# Patient Record
Sex: Female | Born: 1935 | Race: Black or African American | Hispanic: No
Health system: Southern US, Community
[De-identification: ages and names within clinical notes are randomized; demographics above are authoritative.]

---

## 2019-03-11 DIAGNOSIS — E039 Hypothyroidism, unspecified: Secondary | ICD-10-CM | POA: Diagnosis not present

## 2019-03-11 DIAGNOSIS — N183 Chronic kidney disease, stage 3 (moderate): Secondary | ICD-10-CM | POA: Diagnosis not present

## 2019-03-11 DIAGNOSIS — I129 Hypertensive chronic kidney disease with stage 1 through stage 4 chronic kidney disease, or unspecified chronic kidney disease: Secondary | ICD-10-CM | POA: Diagnosis not present

## 2019-03-11 DIAGNOSIS — E559 Vitamin D deficiency, unspecified: Secondary | ICD-10-CM | POA: Diagnosis not present

## 2019-03-14 DIAGNOSIS — N183 Chronic kidney disease, stage 3 (moderate): Secondary | ICD-10-CM | POA: Diagnosis not present

## 2019-03-14 DIAGNOSIS — I129 Hypertensive chronic kidney disease with stage 1 through stage 4 chronic kidney disease, or unspecified chronic kidney disease: Secondary | ICD-10-CM | POA: Diagnosis not present

## 2019-03-14 DIAGNOSIS — E039 Hypothyroidism, unspecified: Secondary | ICD-10-CM | POA: Diagnosis not present

## 2019-03-14 DIAGNOSIS — E559 Vitamin D deficiency, unspecified: Secondary | ICD-10-CM | POA: Diagnosis not present

## 2019-03-17 DIAGNOSIS — Z79899 Other long term (current) drug therapy: Secondary | ICD-10-CM | POA: Diagnosis not present

## 2019-03-18 DIAGNOSIS — E7849 Other hyperlipidemia: Secondary | ICD-10-CM | POA: Diagnosis not present

## 2019-03-18 DIAGNOSIS — E8809 Other disorders of plasma-protein metabolism, not elsewhere classified: Secondary | ICD-10-CM | POA: Diagnosis not present

## 2019-03-18 DIAGNOSIS — I129 Hypertensive chronic kidney disease with stage 1 through stage 4 chronic kidney disease, or unspecified chronic kidney disease: Secondary | ICD-10-CM | POA: Diagnosis not present

## 2019-03-18 DIAGNOSIS — R748 Abnormal levels of other serum enzymes: Secondary | ICD-10-CM | POA: Diagnosis not present

## 2019-03-25 DIAGNOSIS — R2681 Unsteadiness on feet: Secondary | ICD-10-CM | POA: Diagnosis not present

## 2019-03-25 DIAGNOSIS — F4489 Other dissociative and conversion disorders: Secondary | ICD-10-CM | POA: Diagnosis not present

## 2019-03-25 DIAGNOSIS — I129 Hypertensive chronic kidney disease with stage 1 through stage 4 chronic kidney disease, or unspecified chronic kidney disease: Secondary | ICD-10-CM | POA: Diagnosis not present

## 2019-03-26 DIAGNOSIS — Z79899 Other long term (current) drug therapy: Secondary | ICD-10-CM | POA: Diagnosis not present

## 2019-04-01 DIAGNOSIS — N183 Chronic kidney disease, stage 3 (moderate): Secondary | ICD-10-CM | POA: Diagnosis not present

## 2019-04-01 DIAGNOSIS — I129 Hypertensive chronic kidney disease with stage 1 through stage 4 chronic kidney disease, or unspecified chronic kidney disease: Secondary | ICD-10-CM | POA: Diagnosis not present

## 2019-04-01 DIAGNOSIS — E039 Hypothyroidism, unspecified: Secondary | ICD-10-CM | POA: Diagnosis not present

## 2019-04-01 DIAGNOSIS — F5109 Other insomnia not due to a substance or known physiological condition: Secondary | ICD-10-CM | POA: Diagnosis not present

## 2019-04-15 DIAGNOSIS — F5109 Other insomnia not due to a substance or known physiological condition: Secondary | ICD-10-CM | POA: Diagnosis not present

## 2019-04-15 DIAGNOSIS — I129 Hypertensive chronic kidney disease with stage 1 through stage 4 chronic kidney disease, or unspecified chronic kidney disease: Secondary | ICD-10-CM | POA: Diagnosis not present

## 2019-04-15 DIAGNOSIS — E785 Hyperlipidemia, unspecified: Secondary | ICD-10-CM | POA: Diagnosis not present

## 2019-04-15 DIAGNOSIS — E559 Vitamin D deficiency, unspecified: Secondary | ICD-10-CM | POA: Diagnosis not present

## 2019-04-30 DIAGNOSIS — I129 Hypertensive chronic kidney disease with stage 1 through stage 4 chronic kidney disease, or unspecified chronic kidney disease: Secondary | ICD-10-CM | POA: Diagnosis not present

## 2019-04-30 DIAGNOSIS — E559 Vitamin D deficiency, unspecified: Secondary | ICD-10-CM | POA: Diagnosis not present

## 2019-04-30 DIAGNOSIS — E7849 Other hyperlipidemia: Secondary | ICD-10-CM | POA: Diagnosis not present

## 2019-04-30 DIAGNOSIS — F5109 Other insomnia not due to a substance or known physiological condition: Secondary | ICD-10-CM | POA: Diagnosis not present

## 2019-05-13 DIAGNOSIS — R748 Abnormal levels of other serum enzymes: Secondary | ICD-10-CM | POA: Diagnosis not present

## 2019-05-13 DIAGNOSIS — I129 Hypertensive chronic kidney disease with stage 1 through stage 4 chronic kidney disease, or unspecified chronic kidney disease: Secondary | ICD-10-CM | POA: Diagnosis not present

## 2019-05-13 DIAGNOSIS — F5109 Other insomnia not due to a substance or known physiological condition: Secondary | ICD-10-CM | POA: Diagnosis not present

## 2019-05-13 DIAGNOSIS — R634 Abnormal weight loss: Secondary | ICD-10-CM | POA: Diagnosis not present

## 2019-05-16 DIAGNOSIS — R634 Abnormal weight loss: Secondary | ICD-10-CM | POA: Diagnosis not present

## 2019-05-16 DIAGNOSIS — I129 Hypertensive chronic kidney disease with stage 1 through stage 4 chronic kidney disease, or unspecified chronic kidney disease: Secondary | ICD-10-CM | POA: Diagnosis not present

## 2019-05-16 DIAGNOSIS — F5109 Other insomnia not due to a substance or known physiological condition: Secondary | ICD-10-CM | POA: Diagnosis not present

## 2019-05-16 DIAGNOSIS — R945 Abnormal results of liver function studies: Secondary | ICD-10-CM | POA: Diagnosis not present

## 2019-06-03 DIAGNOSIS — N183 Chronic kidney disease, stage 3 (moderate): Secondary | ICD-10-CM | POA: Diagnosis not present

## 2019-06-03 DIAGNOSIS — E039 Hypothyroidism, unspecified: Secondary | ICD-10-CM | POA: Diagnosis not present

## 2019-06-03 DIAGNOSIS — F5109 Other insomnia not due to a substance or known physiological condition: Secondary | ICD-10-CM | POA: Diagnosis not present

## 2019-06-03 DIAGNOSIS — I129 Hypertensive chronic kidney disease with stage 1 through stage 4 chronic kidney disease, or unspecified chronic kidney disease: Secondary | ICD-10-CM | POA: Diagnosis not present

## 2019-06-06 DIAGNOSIS — N183 Chronic kidney disease, stage 3 (moderate): Secondary | ICD-10-CM | POA: Diagnosis not present

## 2019-06-06 DIAGNOSIS — I129 Hypertensive chronic kidney disease with stage 1 through stage 4 chronic kidney disease, or unspecified chronic kidney disease: Secondary | ICD-10-CM | POA: Diagnosis not present

## 2019-06-06 DIAGNOSIS — E039 Hypothyroidism, unspecified: Secondary | ICD-10-CM | POA: Diagnosis not present

## 2019-06-06 DIAGNOSIS — F5109 Other insomnia not due to a substance or known physiological condition: Secondary | ICD-10-CM | POA: Diagnosis not present

## 2019-06-09 DIAGNOSIS — Z20828 Contact with and (suspected) exposure to other viral communicable diseases: Secondary | ICD-10-CM | POA: Diagnosis not present

## 2019-06-12 DIAGNOSIS — I129 Hypertensive chronic kidney disease with stage 1 through stage 4 chronic kidney disease, or unspecified chronic kidney disease: Secondary | ICD-10-CM | POA: Diagnosis not present

## 2019-06-12 DIAGNOSIS — G301 Alzheimer's disease with late onset: Secondary | ICD-10-CM | POA: Diagnosis not present

## 2019-06-12 DIAGNOSIS — E039 Hypothyroidism, unspecified: Secondary | ICD-10-CM | POA: Diagnosis not present

## 2019-06-12 DIAGNOSIS — E782 Mixed hyperlipidemia: Secondary | ICD-10-CM | POA: Diagnosis not present

## 2019-08-04 DIAGNOSIS — Z79899 Other long term (current) drug therapy: Secondary | ICD-10-CM | POA: Diagnosis not present

## 2019-09-30 DIAGNOSIS — Z23 Encounter for immunization: Secondary | ICD-10-CM | POA: Diagnosis not present

## 2020-02-02 DIAGNOSIS — Z0000 Encounter for general adult medical examination without abnormal findings: Secondary | ICD-10-CM | POA: Diagnosis not present

## 2020-02-02 DIAGNOSIS — I129 Hypertensive chronic kidney disease with stage 1 through stage 4 chronic kidney disease, or unspecified chronic kidney disease: Secondary | ICD-10-CM | POA: Diagnosis not present

## 2020-02-02 DIAGNOSIS — E782 Mixed hyperlipidemia: Secondary | ICD-10-CM | POA: Diagnosis not present

## 2020-02-02 DIAGNOSIS — H6123 Impacted cerumen, bilateral: Secondary | ICD-10-CM | POA: Diagnosis not present

## 2020-02-02 DIAGNOSIS — E039 Hypothyroidism, unspecified: Secondary | ICD-10-CM | POA: Diagnosis not present

## 2020-02-15 DIAGNOSIS — N390 Urinary tract infection, site not specified: Secondary | ICD-10-CM | POA: Diagnosis not present

## 2020-02-15 DIAGNOSIS — H6120 Impacted cerumen, unspecified ear: Secondary | ICD-10-CM | POA: Diagnosis not present

## 2020-02-24 DIAGNOSIS — N390 Urinary tract infection, site not specified: Secondary | ICD-10-CM | POA: Diagnosis not present

## 2020-05-04 DIAGNOSIS — H5213 Myopia, bilateral: Secondary | ICD-10-CM | POA: Diagnosis not present

## 2020-05-18 DIAGNOSIS — H2513 Age-related nuclear cataract, bilateral: Secondary | ICD-10-CM | POA: Diagnosis not present

## 2020-05-18 DIAGNOSIS — H401131 Primary open-angle glaucoma, bilateral, mild stage: Secondary | ICD-10-CM | POA: Diagnosis not present

## 2020-05-23 DIAGNOSIS — H2513 Age-related nuclear cataract, bilateral: Secondary | ICD-10-CM | POA: Diagnosis not present

## 2020-05-23 DIAGNOSIS — H401131 Primary open-angle glaucoma, bilateral, mild stage: Secondary | ICD-10-CM | POA: Diagnosis not present

## 2020-05-23 DIAGNOSIS — H25013 Cortical age-related cataract, bilateral: Secondary | ICD-10-CM | POA: Diagnosis not present

## 2020-06-02 DIAGNOSIS — L851 Acquired keratosis [keratoderma] palmaris et plantaris: Secondary | ICD-10-CM | POA: Diagnosis not present

## 2020-06-02 DIAGNOSIS — M79673 Pain in unspecified foot: Secondary | ICD-10-CM | POA: Diagnosis not present

## 2020-06-02 DIAGNOSIS — B351 Tinea unguium: Secondary | ICD-10-CM | POA: Diagnosis not present

## 2020-06-02 DIAGNOSIS — M19079 Primary osteoarthritis, unspecified ankle and foot: Secondary | ICD-10-CM | POA: Diagnosis not present

## 2020-06-28 DIAGNOSIS — H2511 Age-related nuclear cataract, right eye: Secondary | ICD-10-CM | POA: Diagnosis not present

## 2020-06-28 DIAGNOSIS — H25011 Cortical age-related cataract, right eye: Secondary | ICD-10-CM | POA: Diagnosis not present

## 2020-06-28 DIAGNOSIS — H21561 Pupillary abnormality, right eye: Secondary | ICD-10-CM | POA: Diagnosis not present

## 2020-06-28 DIAGNOSIS — H25811 Combined forms of age-related cataract, right eye: Secondary | ICD-10-CM | POA: Diagnosis not present

## 2020-08-04 DIAGNOSIS — B351 Tinea unguium: Secondary | ICD-10-CM | POA: Diagnosis not present

## 2020-08-04 DIAGNOSIS — M79673 Pain in unspecified foot: Secondary | ICD-10-CM | POA: Diagnosis not present

## 2020-08-04 DIAGNOSIS — L605 Yellow nail syndrome: Secondary | ICD-10-CM | POA: Diagnosis not present

## 2020-08-04 DIAGNOSIS — M19079 Primary osteoarthritis, unspecified ankle and foot: Secondary | ICD-10-CM | POA: Diagnosis not present

## 2020-08-09 DIAGNOSIS — I10 Essential (primary) hypertension: Secondary | ICD-10-CM | POA: Diagnosis not present

## 2020-08-09 DIAGNOSIS — G301 Alzheimer's disease with late onset: Secondary | ICD-10-CM | POA: Diagnosis not present

## 2020-09-06 DIAGNOSIS — H25012 Cortical age-related cataract, left eye: Secondary | ICD-10-CM | POA: Diagnosis not present

## 2020-09-06 DIAGNOSIS — H25812 Combined forms of age-related cataract, left eye: Secondary | ICD-10-CM | POA: Diagnosis not present

## 2020-09-06 DIAGNOSIS — H2512 Age-related nuclear cataract, left eye: Secondary | ICD-10-CM | POA: Diagnosis not present

## 2020-09-06 DIAGNOSIS — H5709 Other anomalies of pupillary function: Secondary | ICD-10-CM | POA: Diagnosis not present

## 2020-09-16 DIAGNOSIS — I10 Essential (primary) hypertension: Secondary | ICD-10-CM | POA: Diagnosis not present

## 2020-09-16 DIAGNOSIS — G301 Alzheimer's disease with late onset: Secondary | ICD-10-CM | POA: Diagnosis not present

## 2020-09-16 DIAGNOSIS — E782 Mixed hyperlipidemia: Secondary | ICD-10-CM | POA: Diagnosis not present

## 2020-09-16 DIAGNOSIS — E039 Hypothyroidism, unspecified: Secondary | ICD-10-CM | POA: Diagnosis not present

## 2020-10-26 DIAGNOSIS — L851 Acquired keratosis [keratoderma] palmaris et plantaris: Secondary | ICD-10-CM | POA: Diagnosis not present

## 2020-10-26 DIAGNOSIS — M19079 Primary osteoarthritis, unspecified ankle and foot: Secondary | ICD-10-CM | POA: Diagnosis not present

## 2020-10-26 DIAGNOSIS — B351 Tinea unguium: Secondary | ICD-10-CM | POA: Diagnosis not present

## 2020-10-26 DIAGNOSIS — M79673 Pain in unspecified foot: Secondary | ICD-10-CM | POA: Diagnosis not present

## 2020-11-29 DIAGNOSIS — R4189 Other symptoms and signs involving cognitive functions and awareness: Secondary | ICD-10-CM | POA: Diagnosis not present

## 2020-12-21 DIAGNOSIS — G301 Alzheimer's disease with late onset: Secondary | ICD-10-CM | POA: Diagnosis not present

## 2020-12-21 DIAGNOSIS — I10 Essential (primary) hypertension: Secondary | ICD-10-CM | POA: Diagnosis not present

## 2020-12-21 DIAGNOSIS — E782 Mixed hyperlipidemia: Secondary | ICD-10-CM | POA: Diagnosis not present

## 2020-12-21 DIAGNOSIS — E039 Hypothyroidism, unspecified: Secondary | ICD-10-CM | POA: Diagnosis not present

## 2020-12-27 DIAGNOSIS — R4189 Other symptoms and signs involving cognitive functions and awareness: Secondary | ICD-10-CM | POA: Diagnosis not present

## 2020-12-29 DIAGNOSIS — M79673 Pain in unspecified foot: Secondary | ICD-10-CM | POA: Diagnosis not present

## 2020-12-29 DIAGNOSIS — M19079 Primary osteoarthritis, unspecified ankle and foot: Secondary | ICD-10-CM | POA: Diagnosis not present

## 2020-12-29 DIAGNOSIS — L605 Yellow nail syndrome: Secondary | ICD-10-CM | POA: Diagnosis not present

## 2020-12-29 DIAGNOSIS — B351 Tinea unguium: Secondary | ICD-10-CM | POA: Diagnosis not present

## 2021-01-30 DIAGNOSIS — R4189 Other symptoms and signs involving cognitive functions and awareness: Secondary | ICD-10-CM | POA: Diagnosis not present

## 2021-02-01 DIAGNOSIS — Z20828 Contact with and (suspected) exposure to other viral communicable diseases: Secondary | ICD-10-CM | POA: Diagnosis not present

## 2021-02-10 DIAGNOSIS — I10 Essential (primary) hypertension: Secondary | ICD-10-CM | POA: Diagnosis not present

## 2021-02-10 DIAGNOSIS — E782 Mixed hyperlipidemia: Secondary | ICD-10-CM | POA: Diagnosis not present

## 2021-02-10 DIAGNOSIS — E039 Hypothyroidism, unspecified: Secondary | ICD-10-CM | POA: Diagnosis not present

## 2021-02-10 DIAGNOSIS — D539 Nutritional anemia, unspecified: Secondary | ICD-10-CM | POA: Diagnosis not present

## 2021-02-10 DIAGNOSIS — Z0000 Encounter for general adult medical examination without abnormal findings: Secondary | ICD-10-CM | POA: Diagnosis not present

## 2021-02-10 DIAGNOSIS — Z1389 Encounter for screening for other disorder: Secondary | ICD-10-CM | POA: Diagnosis not present

## 2021-03-20 DIAGNOSIS — E782 Mixed hyperlipidemia: Secondary | ICD-10-CM | POA: Diagnosis not present

## 2021-03-20 DIAGNOSIS — E039 Hypothyroidism, unspecified: Secondary | ICD-10-CM | POA: Diagnosis not present

## 2021-03-20 DIAGNOSIS — I10 Essential (primary) hypertension: Secondary | ICD-10-CM | POA: Diagnosis not present

## 2021-03-20 DIAGNOSIS — G301 Alzheimer's disease with late onset: Secondary | ICD-10-CM | POA: Diagnosis not present

## 2021-04-25 DIAGNOSIS — R4189 Other symptoms and signs involving cognitive functions and awareness: Secondary | ICD-10-CM | POA: Diagnosis not present

## 2021-05-04 DIAGNOSIS — H401131 Primary open-angle glaucoma, bilateral, mild stage: Secondary | ICD-10-CM | POA: Diagnosis not present

## 2021-05-05 DIAGNOSIS — F0280 Dementia in other diseases classified elsewhere without behavioral disturbance: Secondary | ICD-10-CM | POA: Diagnosis not present

## 2021-05-05 DIAGNOSIS — E039 Hypothyroidism, unspecified: Secondary | ICD-10-CM | POA: Diagnosis not present

## 2021-05-05 DIAGNOSIS — Z79899 Other long term (current) drug therapy: Secondary | ICD-10-CM | POA: Diagnosis not present

## 2021-05-05 DIAGNOSIS — F5101 Primary insomnia: Secondary | ICD-10-CM | POA: Diagnosis not present

## 2021-05-05 DIAGNOSIS — I10 Essential (primary) hypertension: Secondary | ICD-10-CM | POA: Diagnosis not present

## 2021-05-05 DIAGNOSIS — G301 Alzheimer's disease with late onset: Secondary | ICD-10-CM | POA: Diagnosis not present

## 2021-05-10 DIAGNOSIS — E782 Mixed hyperlipidemia: Secondary | ICD-10-CM | POA: Diagnosis not present

## 2021-05-10 DIAGNOSIS — E039 Hypothyroidism, unspecified: Secondary | ICD-10-CM | POA: Diagnosis not present

## 2021-05-10 DIAGNOSIS — N1831 Chronic kidney disease, stage 3a: Secondary | ICD-10-CM | POA: Diagnosis not present

## 2021-05-10 DIAGNOSIS — G301 Alzheimer's disease with late onset: Secondary | ICD-10-CM | POA: Diagnosis not present

## 2021-05-21 ENCOUNTER — Emergency Department (HOSPITAL_COMMUNITY): Payer: Medicare Other

## 2021-05-21 ENCOUNTER — Other Ambulatory Visit: Payer: Self-pay

## 2021-05-21 ENCOUNTER — Encounter (HOSPITAL_COMMUNITY): Payer: Self-pay

## 2021-05-21 ENCOUNTER — Emergency Department (HOSPITAL_COMMUNITY)
Admission: EM | Admit: 2021-05-21 | Discharge: 2021-05-21 | Disposition: A | Discharge status: Home / Self Care | Payer: Medicare Other | Attending: Emergency Medicine | Admitting: Emergency Medicine

## 2021-05-21 DIAGNOSIS — F0390 Unspecified dementia without behavioral disturbance: Secondary | ICD-10-CM | POA: Insufficient documentation

## 2021-05-21 DIAGNOSIS — M1712 Unilateral primary osteoarthritis, left knee: Secondary | ICD-10-CM | POA: Diagnosis not present

## 2021-05-21 DIAGNOSIS — W19XXXA Unspecified fall, initial encounter: Secondary | ICD-10-CM

## 2021-05-21 DIAGNOSIS — W06XXXA Fall from bed, initial encounter: Secondary | ICD-10-CM | POA: Diagnosis not present

## 2021-05-21 DIAGNOSIS — M25561 Pain in right knee: Secondary | ICD-10-CM

## 2021-05-21 DIAGNOSIS — M170 Bilateral primary osteoarthritis of knee: Secondary | ICD-10-CM | POA: Insufficient documentation

## 2021-05-21 DIAGNOSIS — Z043 Encounter for examination and observation following other accident: Secondary | ICD-10-CM | POA: Diagnosis not present

## 2021-05-21 DIAGNOSIS — M1711 Unilateral primary osteoarthritis, right knee: Secondary | ICD-10-CM | POA: Diagnosis not present

## 2021-05-21 DIAGNOSIS — M25562 Pain in left knee: Secondary | ICD-10-CM

## 2021-05-21 DIAGNOSIS — Y92119 Unspecified place in children's home and orphanage as the place of occurrence of the external cause: Secondary | ICD-10-CM | POA: Insufficient documentation

## 2021-05-21 DIAGNOSIS — R404 Transient alteration of awareness: Secondary | ICD-10-CM | POA: Diagnosis not present

## 2021-05-21 DIAGNOSIS — I10 Essential (primary) hypertension: Secondary | ICD-10-CM | POA: Diagnosis not present

## 2021-05-21 MED ORDER — ACETAMINOPHEN 325 MG PO TABS: 650.0000 mg | ORAL_TABLET | Freq: Once | ORAL | Status: DC

## 2021-05-21 NOTE — ED Notes (Addendum)
Pt refuse to have her urine soaked linen changed.

## 2021-05-21 NOTE — Discharge Instructions (Signed)
There were no serious injuries found from the fall.  She has arthritis of her knees that can be treated with Tylenol.  Follow-up with her doctor as needed for problems.

## 2021-05-21 NOTE — ED Triage Notes (Signed)
EMS reports from [facility], fall from bed this morning, staff heard and then found Pt sitting on ground next to bed immediately after fall. Pt c/o left upper leg pain. No obvious injury, no LOC or blood thinners. Pt able to stand after fall and ambulating independently on EMS arrival. Hx of dementia and at baseline per staff.  BP 174/102 HR 76 RR 18 Sp02 98 RA CBG 134 Temp 98.1

## 2021-05-21 NOTE — ED Notes (Signed)
Pt got agitated when ask to be ambulated (refused). Pt stated to go away and not talk to her.

## 2021-05-21 NOTE — ED Notes (Signed)
Pt swung her fist while attempting to take vitals

## 2021-05-21 NOTE — ED Provider Notes (Signed)
[facility]-EMERGENCY DEPT Provider Note   CSN: [id number] Arrival date & time: 05/21/21  6283     History Chief Complaint  Patient presents with  . Fall  . Leg Pain    Anna Stanley is a 87 y.o. female.  HPI Patient lives at a facility where she was found on floor this morning.  No known mechanism for fall.  Staff there suspected left upper leg pain, following fall.  Patient has dementia and is unable to give any history at all.  She is unable to specify if she hurts or not.  Level 5 caveat-dementia    History reviewed. No pertinent past medical history.  There are no problems to display for this patient.   History reviewed. No pertinent surgical history.   OB History   No obstetric history on file.     History reviewed. No pertinent family history.     Home Medications Prior to Admission medications   Not on File    Allergies    Patient has no known allergies.  Review of Systems   Review of Systems  Unable to perform ROS: Dementia    Physical Exam Updated Vital Signs BP (!) 167/95   Pulse 85   Temp 97.9 F (36.6 C) (Oral)   Resp 18   SpO2 97%   Physical Exam Vitals and nursing note reviewed.  Constitutional:      General: She is not in acute distress.    Appearance: She is well-developed. She is obese. She is not ill-appearing, toxic-appearing or diaphoretic.  HENT:     Head: Normocephalic and atraumatic.     Right Ear: External ear normal.     Left Ear: External ear normal.  Eyes:     Conjunctiva/sclera: Conjunctivae normal.     Pupils: Pupils are equal, round, and reactive to light.  Neck:     Trachea: Phonation normal.  Cardiovascular:     Rate and Rhythm: Normal rate.  Pulmonary:     Effort: Pulmonary effort is normal.  Abdominal:     General: There is no distension.     Tenderness: There is no abdominal tenderness.  Musculoskeletal:     Cervical back: Normal range of motion and neck supple.     Comments:  No large joint deformities.  Mild tenderness of both knees.  With passive range of motion testing of the lower extremities she has knee pain but not hip pain.  With active strength testing she can independently elevate both legs off the stretcher.  Normal range of motion arms actively.  Skin:    General: Skin is warm and dry.  Neurological:     Mental Status: She is alert. She is disoriented.     Cranial Nerves: No cranial nerve deficit.     Motor: No abnormal muscle tone.     Coordination: Coordination normal.  Psychiatric:        Mood and Affect: Mood normal.        Behavior: Behavior normal.     ED Results / Procedures / Treatments   Labs (all labs ordered are listed, but only abnormal results are displayed) Labs Reviewed - No data to display  EKG None  Radiology DG Pelvis 1-2 Views  Result Date: 05/21/2021 CLINICAL DATA:  Fall. EXAM: PELVIS - 1-2 VIEW COMPARISON:  None. FINDINGS: Pedicle rods and screws are seen in the lower lumbar spine. No fractures identified. IMPRESSION: Negative. Electronically Signed   By: Gerome Sam III M.D  On: 05/21/2021 10:20   DG Knee Complete 4 Views Left  Result Date: 05/21/2021 CLINICAL DATA:  Pain after fall EXAM: LEFT KNEE - COMPLETE 4+ VIEW COMPARISON:  None. FINDINGS: No fracture or dislocation. Tricompartmental degenerative changes. Mild loss of joint space in the medial compartment. No significant joint effusion. No other abnormalities. IMPRESSION: No fracture or significant effusion. Tricompartmental degenerative changes. Loss of joint space medially. Electronically Signed   By: Gerome Sam III M.D   On: 05/21/2021 10:22   DG Knee Complete 4 Views Right  Result Date: 05/21/2021 CLINICAL DATA:  Pain after fall EXAM: RIGHT KNEE - COMPLETE 4+ VIEW COMPARISON:  None. FINDINGS: Tricompartmental degenerative changes. No significant joint effusion. No fractures. No other acute abnormalities. IMPRESSION: Tricompartmental degenerative  changes. No other acute abnormalities. Electronically Signed   By: Gerome Sam III M.D   On: 05/21/2021 10:22    Procedures Procedures   Medications Ordered in ED Medications  acetaminophen (TYLENOL) tablet 650 mg (has no administration in time range)    ED Course  I have reviewed the triage vital signs and the nursing notes.  Pertinent labs & imaging results that were available during my care of the patient were reviewed by me and considered in my medical decision making (see chart for details).    MDM Rules/Calculators/A&P                           Patient Vitals for the past 24 hrs:  BP Temp Temp src Pulse Resp SpO2  05/21/21 1203 (!) 167/95 -- -- 85 18 97 %  05/21/21 1025 (!) 151/101 -- -- 72 18 98 %  05/21/21 0900 (!) 171/95 -- -- 72 18 98 %  05/21/21 0828 (!) 174/92 97.9 F (36.6 C) Oral 73 18 96 %    12:22 PM Reevaluation with update and discussion. After initial assessment and treatment, an updated evaluation reveals no change in clinical status.  Patient refused to ambulate. Mancel Bale   Medical Decision Making:  This patient is presenting for evaluation of fall, which does require a range of treatment options, and is a complaint that involves a moderate risk of morbidity and mortality. The differential diagnoses include fracture, contusion, weakness, illness. I decided to review old records, and in summary [place] female lives in a nursing care facility, found on floor, with suspected left upper leg injury.  She is unable to give history.    Radiologic Tests Ordered, included bilateral knees.  I independently Visualized: Radiographic images, which show no fracture    Critical Interventions-clinical evaluation, radiography  After These Interventions, the Patient was reevaluated and was found stable for discharge.  She apparently had a ground-level fall, did not sustain any serious injuries.  Doubt hip fracture, spine injury or new CNS abnormality.  CRITICAL  CARE-no Performed by: Mancel Bale  Nursing Notes Reviewed/ Care Coordinated Applicable Imaging Reviewed Interpretation of Laboratory Data incorporated into ED treatment  The patient appears reasonably screened and/or stabilized for discharge and I doubt any other medical condition or other [facility] requiring further screening, evaluation, or treatment in the ED at this time prior to discharge.  Plan: Home Medications-continue usual medications; Home Treatments-rest, heat to affected area; return here if the recommended treatment, does not improve the symptoms; Recommended follow up-PCP, as needed     Final Clinical Impression(s) / ED Diagnoses Final diagnoses:  Fall, initial encounter  Pain in both knees, unspecified chronicity  Osteoarthritis of both knees,  unspecified osteoarthritis type    Rx / DC Orders ED Discharge Orders    None       Mancel Bale, MD 05/21/21 1728

## 2021-06-15 DIAGNOSIS — J209 Acute bronchitis, unspecified: Secondary | ICD-10-CM | POA: Diagnosis not present

## 2021-06-15 DIAGNOSIS — R059 Cough, unspecified: Secondary | ICD-10-CM | POA: Diagnosis not present

## 2021-06-22 DIAGNOSIS — I129 Hypertensive chronic kidney disease with stage 1 through stage 4 chronic kidney disease, or unspecified chronic kidney disease: Secondary | ICD-10-CM | POA: Diagnosis not present

## 2021-06-22 DIAGNOSIS — E039 Hypothyroidism, unspecified: Secondary | ICD-10-CM | POA: Diagnosis not present

## 2021-06-22 DIAGNOSIS — E782 Mixed hyperlipidemia: Secondary | ICD-10-CM | POA: Diagnosis not present

## 2021-06-22 DIAGNOSIS — G301 Alzheimer's disease with late onset: Secondary | ICD-10-CM | POA: Diagnosis not present

## 2021-07-06 DIAGNOSIS — E038 Other specified hypothyroidism: Secondary | ICD-10-CM | POA: Diagnosis not present

## 2021-07-06 DIAGNOSIS — N1831 Chronic kidney disease, stage 3a: Secondary | ICD-10-CM | POA: Diagnosis not present

## 2021-07-31 DEATH — deceased

## 2023-02-27 IMAGING — CR DG PELVIS 1-2V
1 series · 1 of 1 positions shown · non-contrast
Comparison: None.

CLINICAL DATA: Fall.

EXAM:
PELVIS - 1-2 VIEW

[x pelvis]
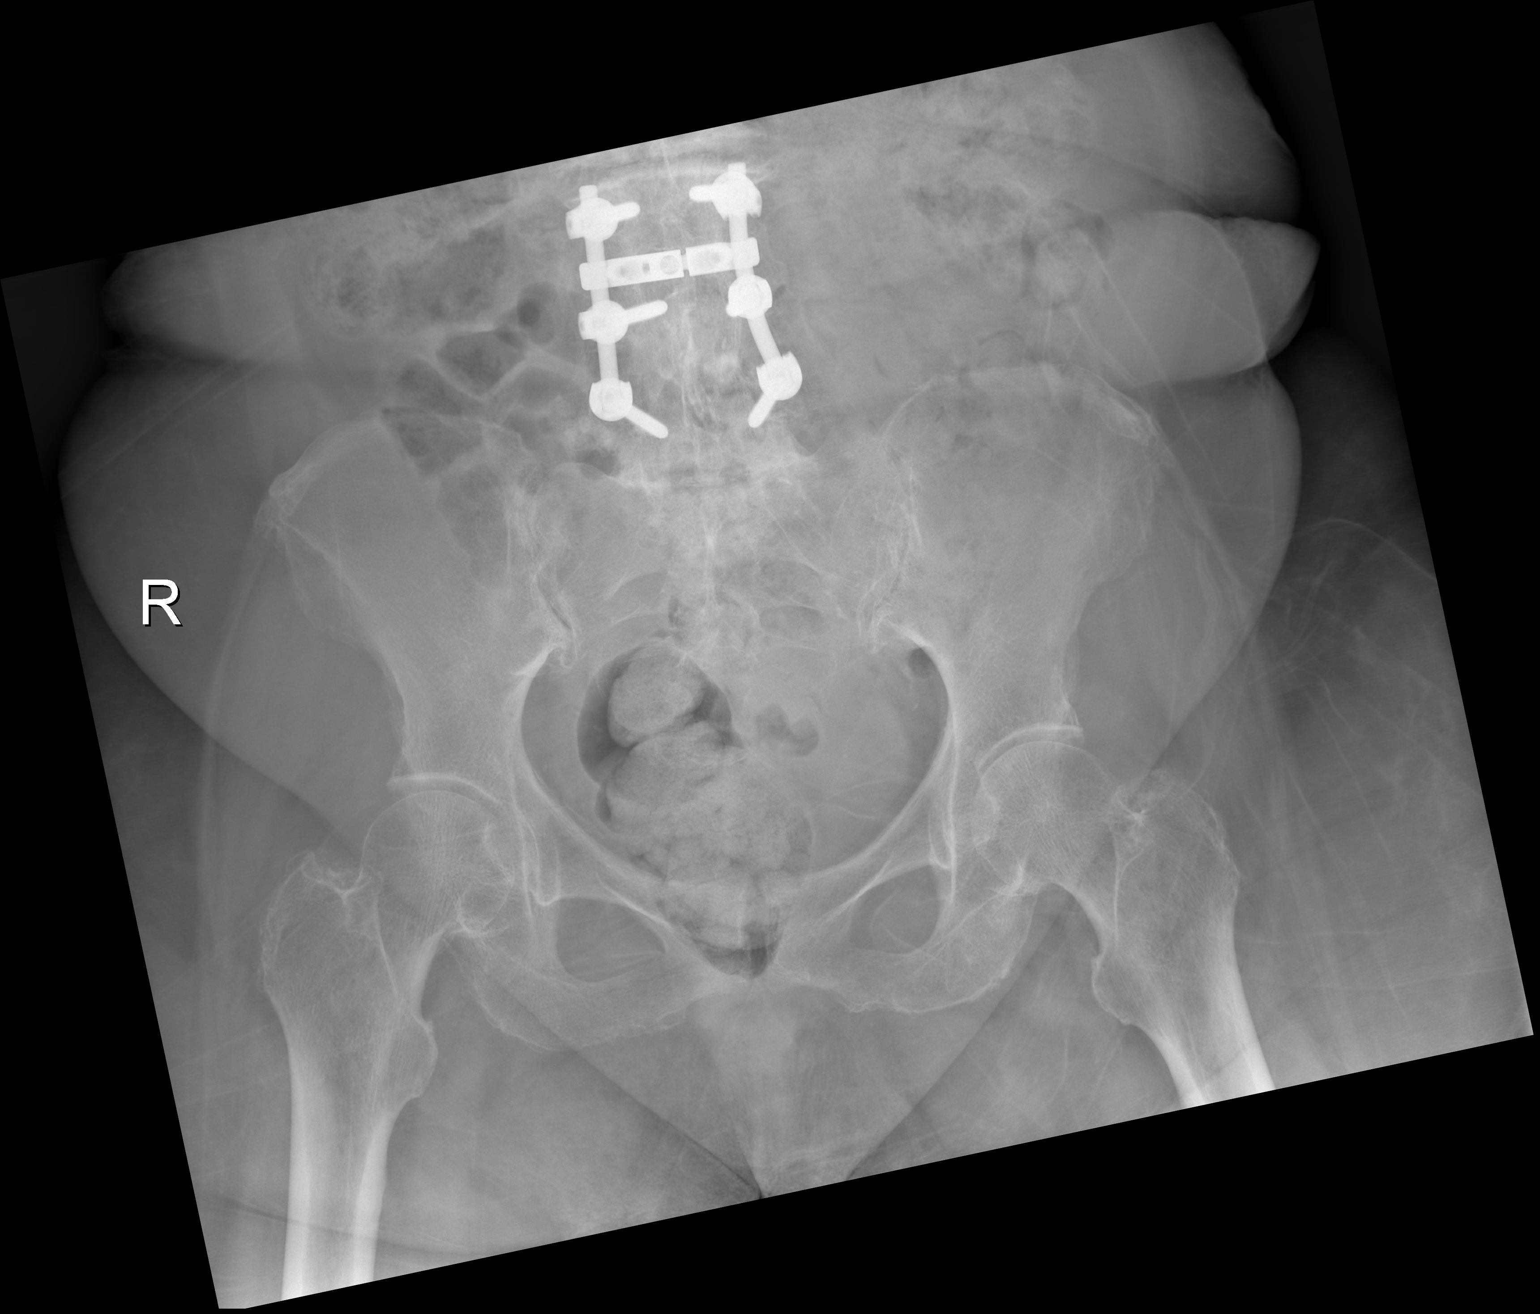

[1 of 1 positions shown; findings below may reference images not displayed]

FINDINGS: Pedicle rods and screws are seen in the lower lumbar spine. No
fractures identified.
IMPRESSION: Negative.

## 2023-02-27 IMAGING — CR DG KNEE COMPLETE 4+V*L*
4 series · 4 of 4 positions shown · non-contrast
Comparison: None.

CLINICAL DATA: Pain after fall

EXAM:
LEFT KNEE - COMPLETE 4+ VIEW

[x knee ap left (1 of 3)]
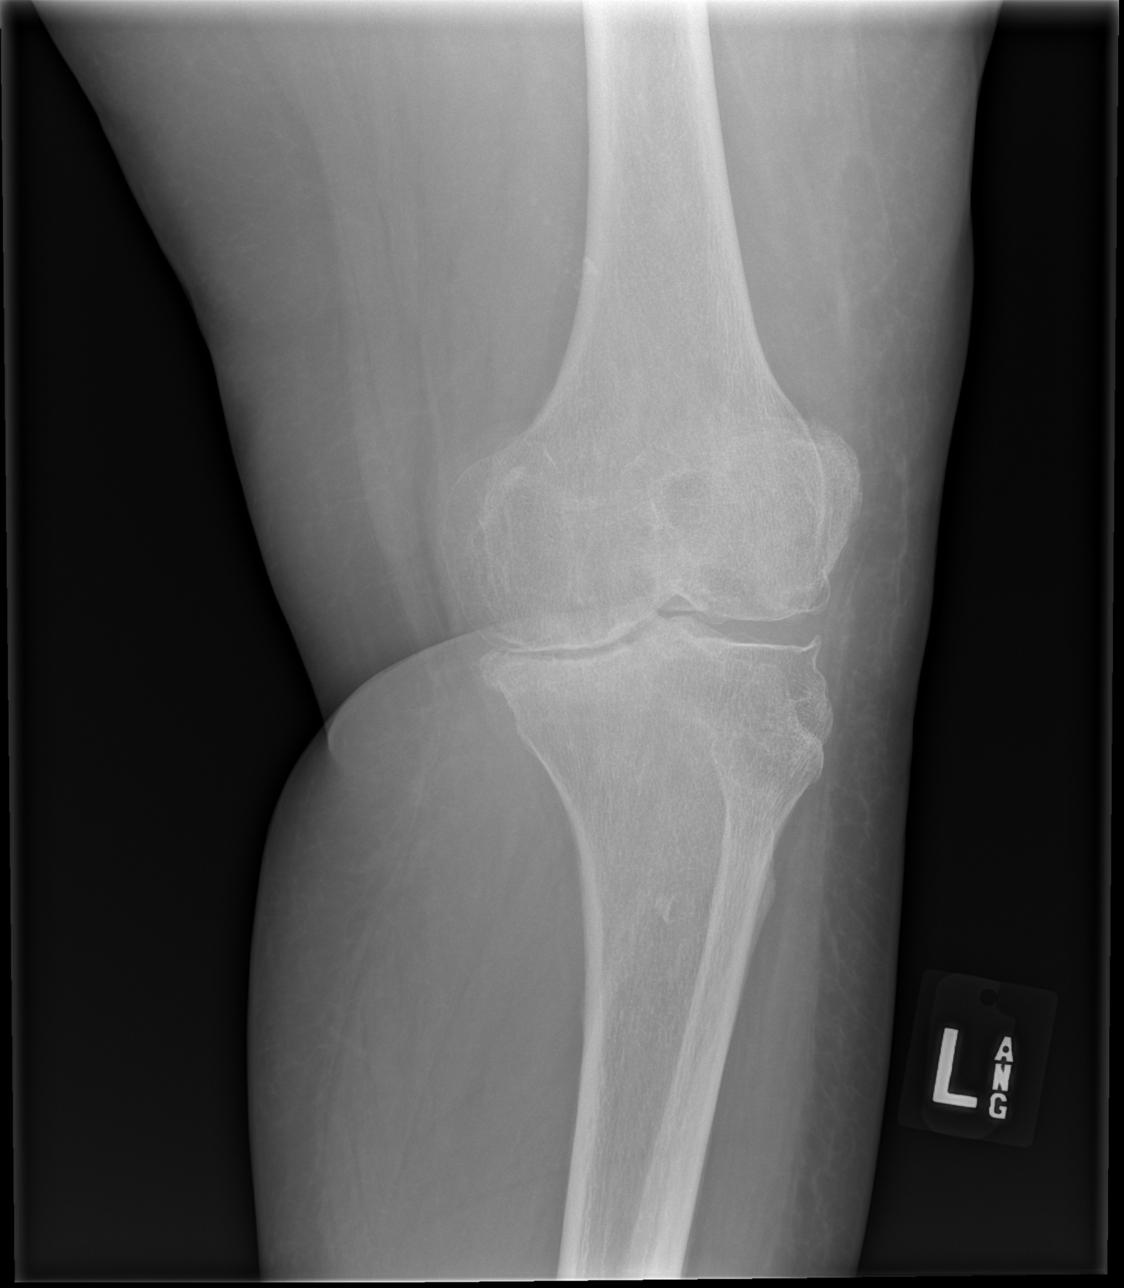

[x knee ap left (2 of 3)]
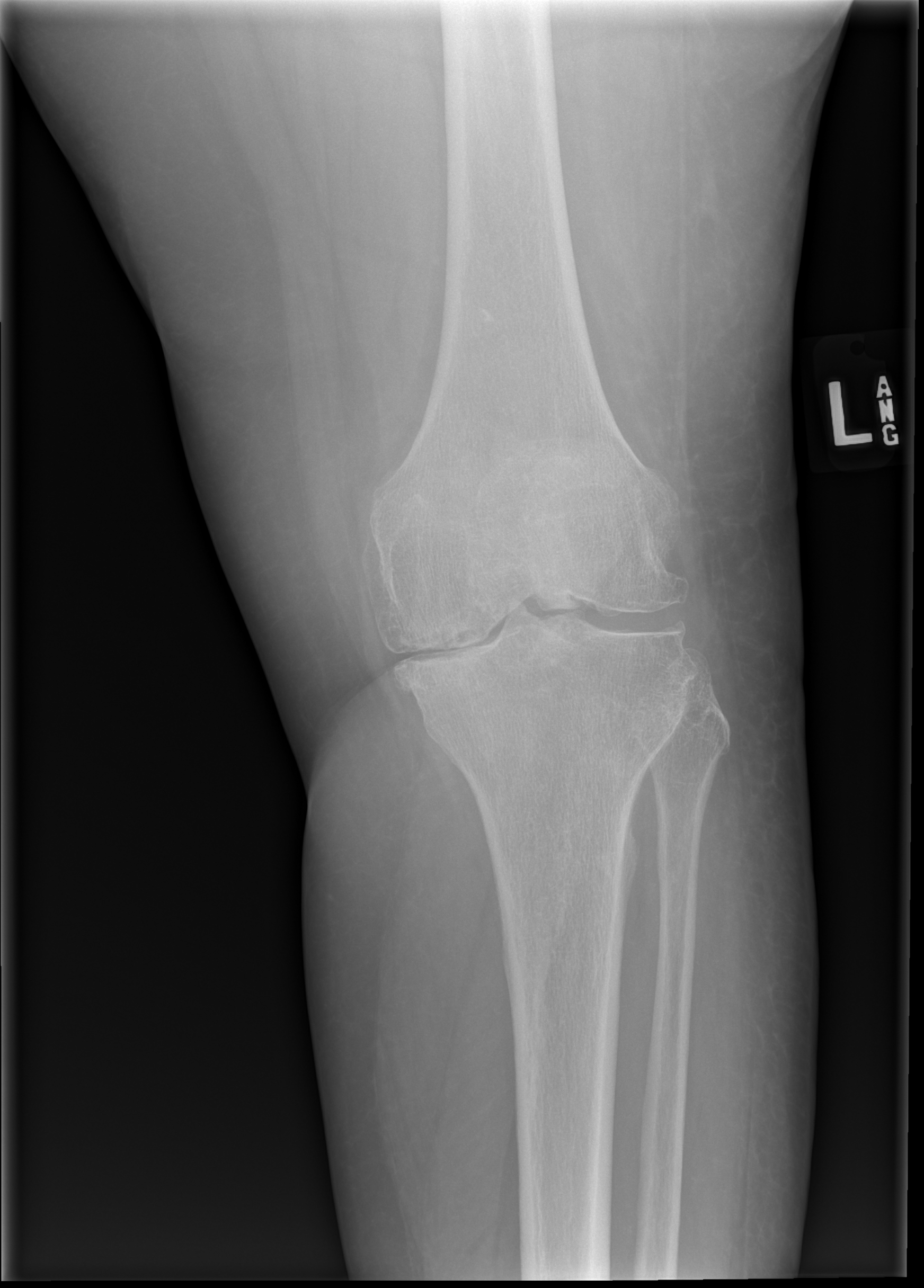

[x knee ap left (3 of 3)]
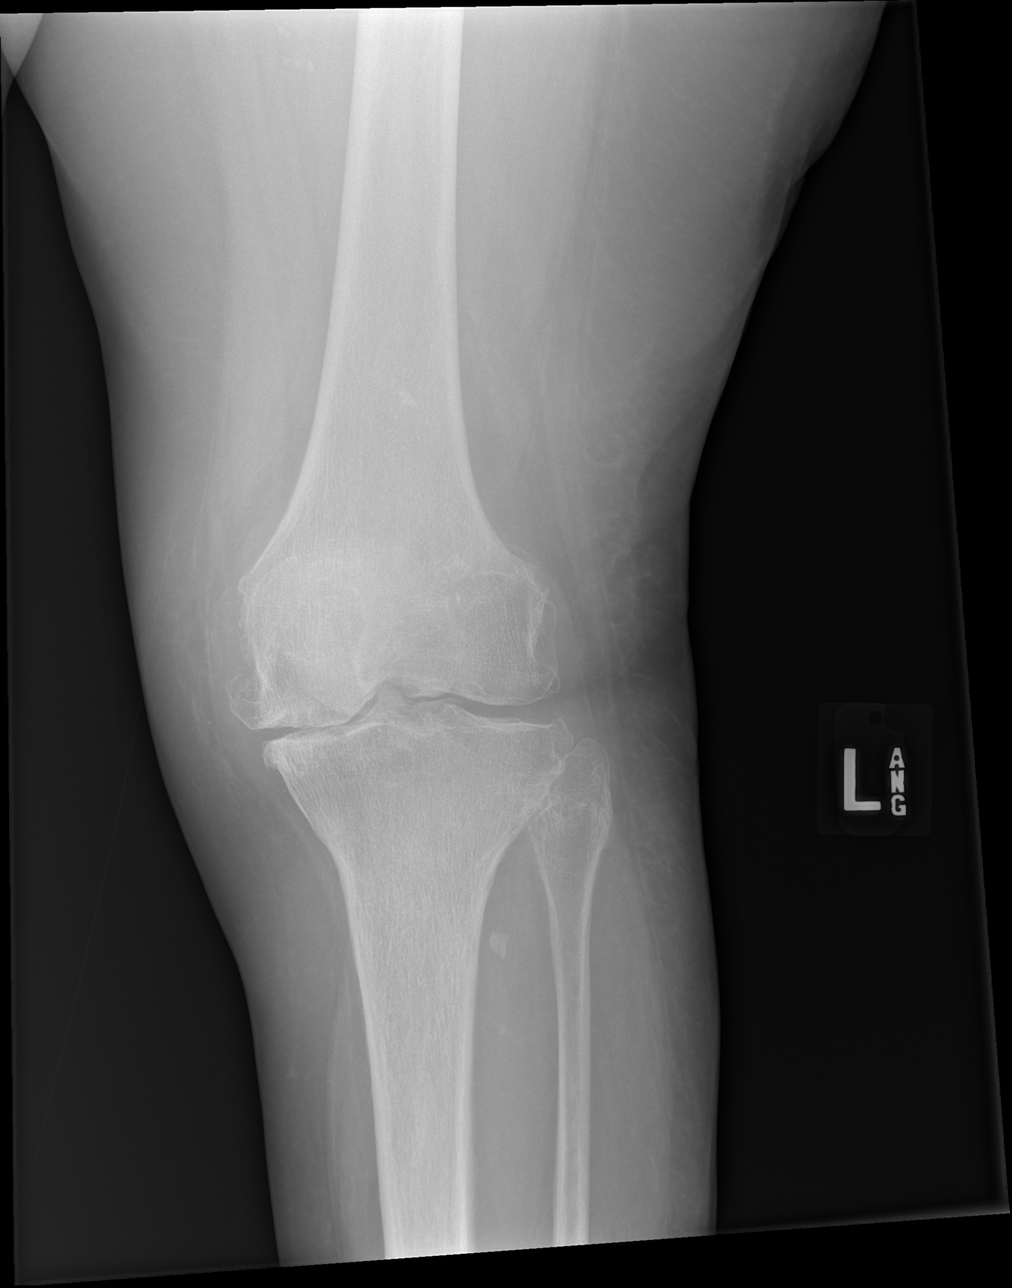

[x knee lat left]
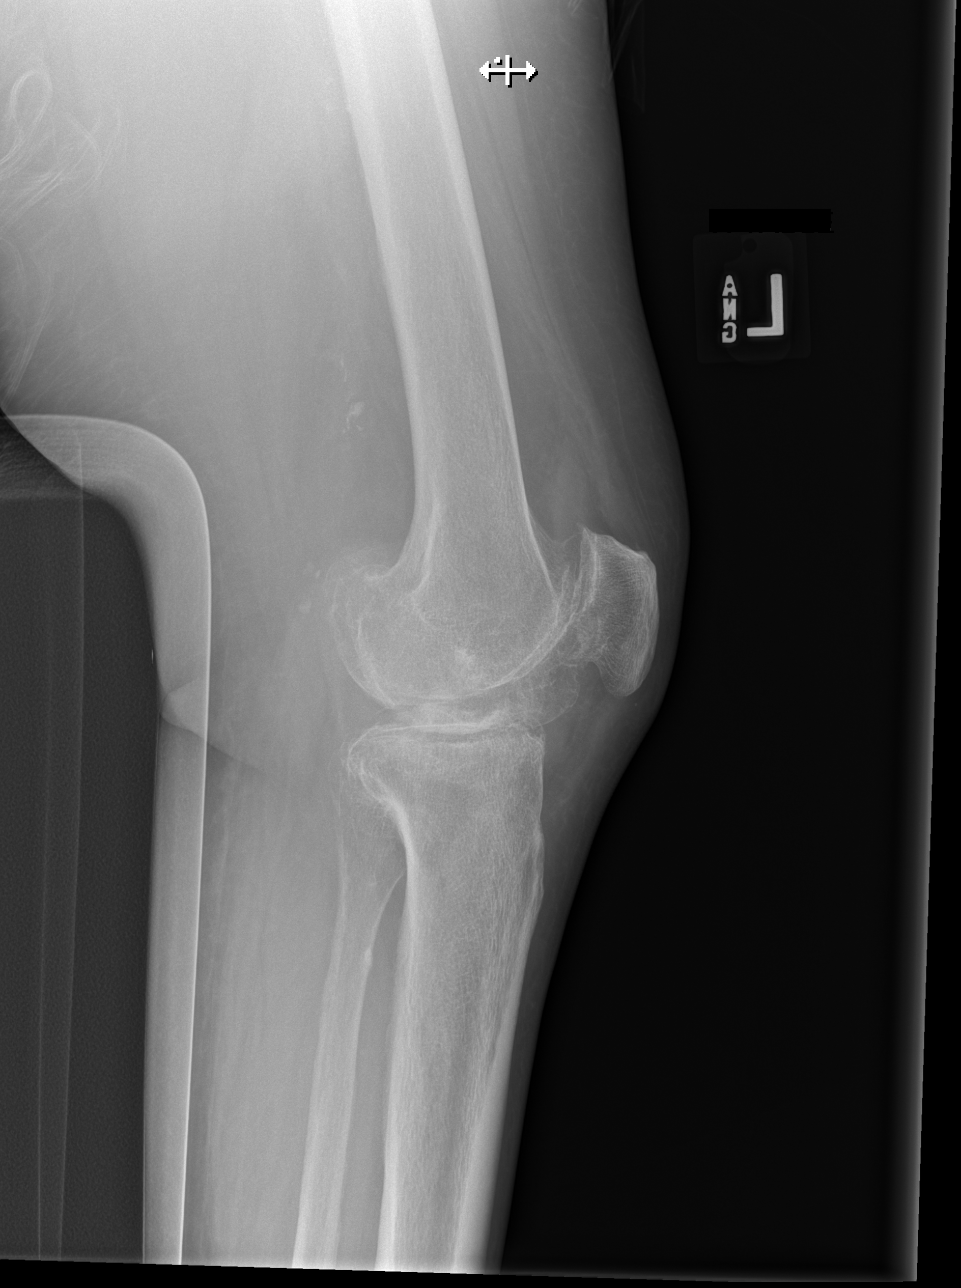

[4 of 4 positions shown; findings below may reference images not displayed]

FINDINGS: No fracture or dislocation. Tricompartmental degenerative changes.
Mild loss of joint space in the medial compartment. No significant
joint effusion. No other abnormalities.
IMPRESSION: No fracture or significant effusion. Tricompartmental degenerative
changes. Loss of joint space medially.
